# Patient Record
Sex: Male | Born: 1980 | Race: Black or African American | Hispanic: No | Marital: Single | Smoking: Never smoker
Health system: Southern US, Community
[De-identification: ages and names within clinical notes are randomized; demographics above are authoritative.]

## PROBLEM LIST (undated history)

## (undated) DIAGNOSIS — W57XXXA Bitten or stung by nonvenomous insect and other nonvenomous arthropods, initial encounter: Secondary | ICD-10-CM

## (undated) HISTORY — PX: NECK SURGERY: SHX720

---

## 2015-12-22 ENCOUNTER — Ambulatory Visit
Admission: EM | Admit: 2015-12-22 | Discharge: 2015-12-22 | Disposition: A | Discharge status: Home / Self Care | Payer: BLUE CROSS/BLUE SHIELD | Attending: Family Medicine | Admitting: Family Medicine

## 2015-12-22 ENCOUNTER — Ambulatory Visit (INDEPENDENT_AMBULATORY_CARE_PROVIDER_SITE_OTHER): Payer: BLUE CROSS/BLUE SHIELD

## 2015-12-22 ENCOUNTER — Encounter: Payer: Self-pay | Admitting: Emergency Medicine

## 2015-12-22 DIAGNOSIS — R109 Unspecified abdominal pain: Secondary | ICD-10-CM

## 2015-12-22 HISTORY — DX: Bitten or stung by nonvenomous insect and other nonvenomous arthropods, initial encounter: W57.XXXA

## 2015-12-22 LAB — URINALYSIS COMPLETE WITH MICROSCOPIC (ARMC ONLY)
BILIRUBIN URINE: NEGATIVE
Bacteria, UA: NONE SEEN
GLUCOSE, UA: NEGATIVE mg/dL
Ketones, ur: NEGATIVE mg/dL
LEUKOCYTES UA: NEGATIVE
NITRITE: NEGATIVE
Protein, ur: NEGATIVE mg/dL
pH: 5.5 (ref 5.0–8.0)

## 2015-12-22 NOTE — ED Notes (Addendum)
Abdominal pain left upper quadrant that radiates to back. Abdomen is swollen. Seen at [facility] was told it was a pulled muscle. No xrays performed

## 2015-12-22 NOTE — ED Provider Notes (Signed)
Patient presents today with symptoms of left flank pain extending to the left labdomen. Patient states that he's had the symptoms for several days. He states that he was [place] He was told that his symptoms were likely related to a muscle injury. Patient denies any heavy lifting or injury when the symptoms initially presented. He denies any fever, urinary symptoms, diarrhea, constipation, vomiting, penile discharge, dysuria. He denies any history of kidney stones. He denies any recent history of sore throat suggestive of infectious mono. He denies having a rash in the area of pain.  ROS: Negative except mentioned above.  Vitals as per Epic.  GENERAL: NAD HEENT: no pharyngeal erythema, no exudate, no cervical LAD RESP: CTA B CARD: RRR ABD: +BS, mild left flank tenderness extending to left abdomen, no rebound or guarding, no mass appreciated, no inquinal tenderness, no hernia appreciated  NEURO: CN II-XII grossly intact   A/P: Left-sided flank pain, Left abdominal pain-urine analysis and x-ray results were discussed with the patient. Would recommend CT scan to further evaluate patient's symptoms. Patient is in no acute distress at this time. I have asked that if he does have worsening symptoms that he goes to the ER. He should follow up with primary care physician in the near future to further workup his current symptoms with a CT scan.    Jolene ProvostKirtida Maili Shutters, MD 12/22/15 [phone]

## 2016-05-21 IMAGING — CR DG ABDOMEN 1V
2 series · 2 of 2 positions shown · non-contrast
Comparison: None.

CLINICAL DATA: Initial encounter for 2 week history of left lower
flank pain.

EXAM:
ABDOMEN - 1 VIEW

[abdomen kub (1 of 2)]
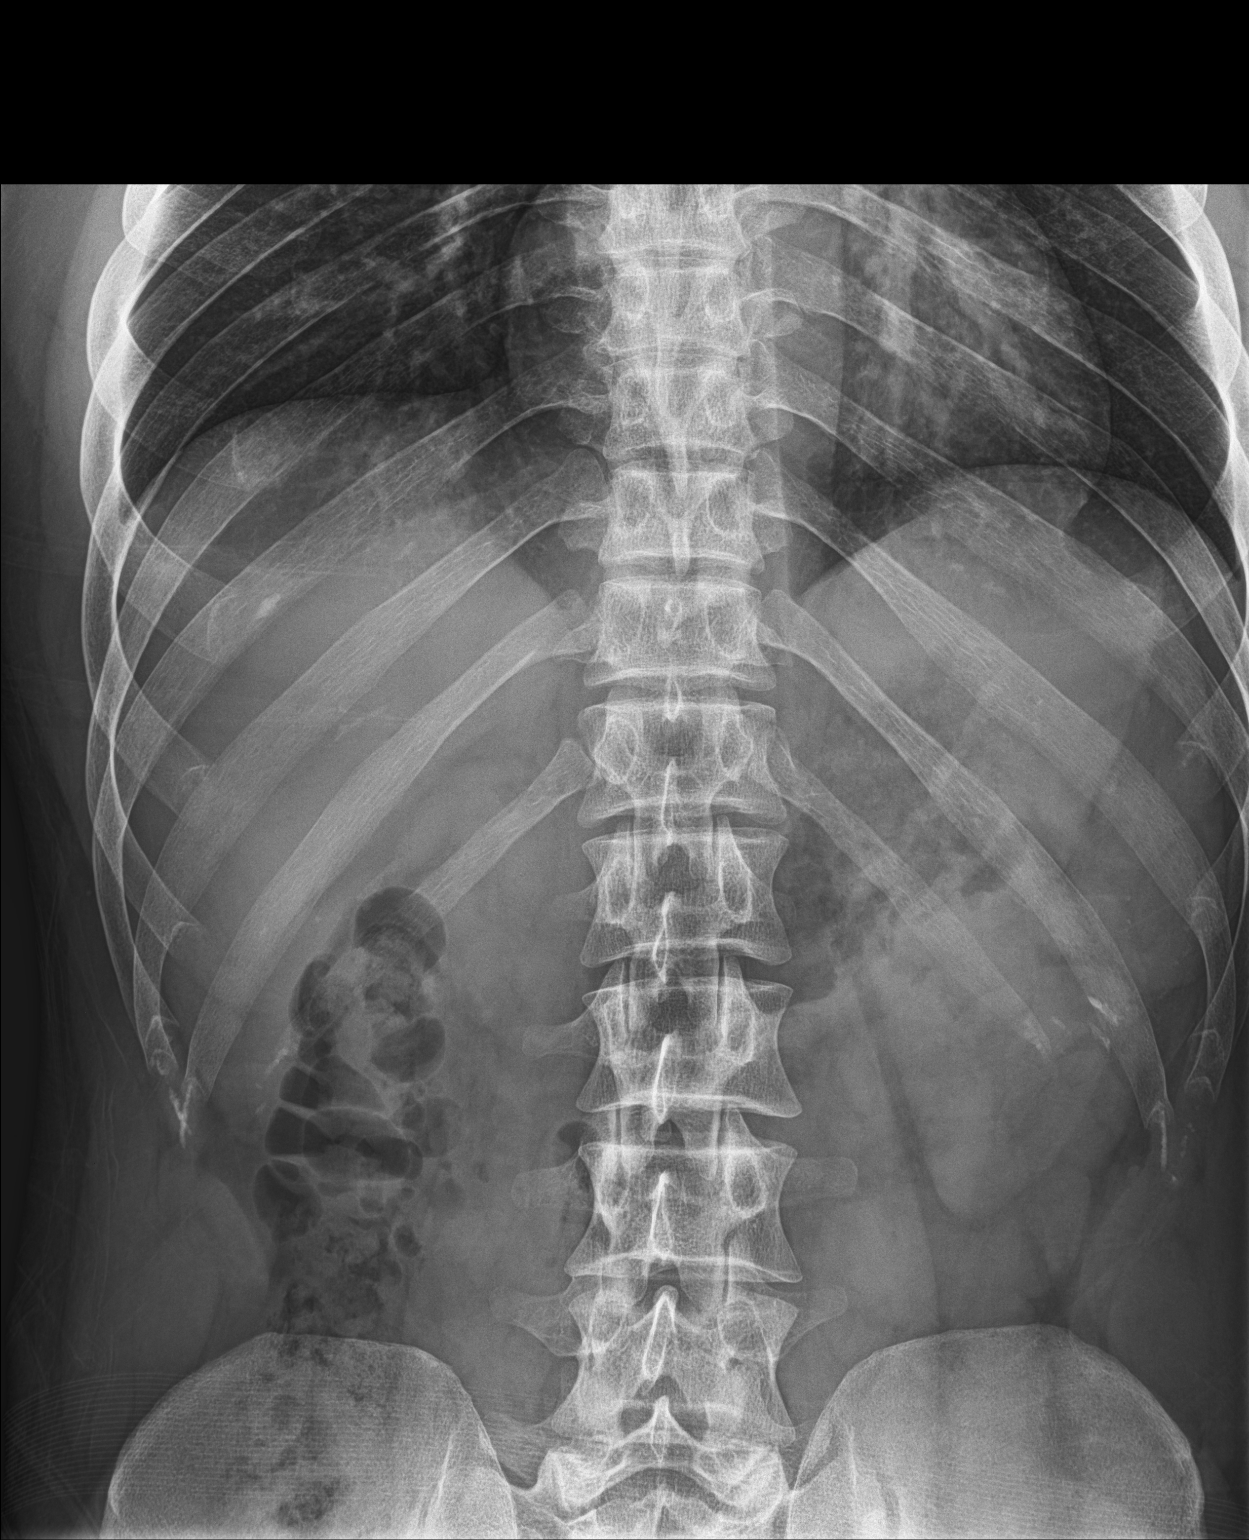

[abdomen kub (2 of 2)]
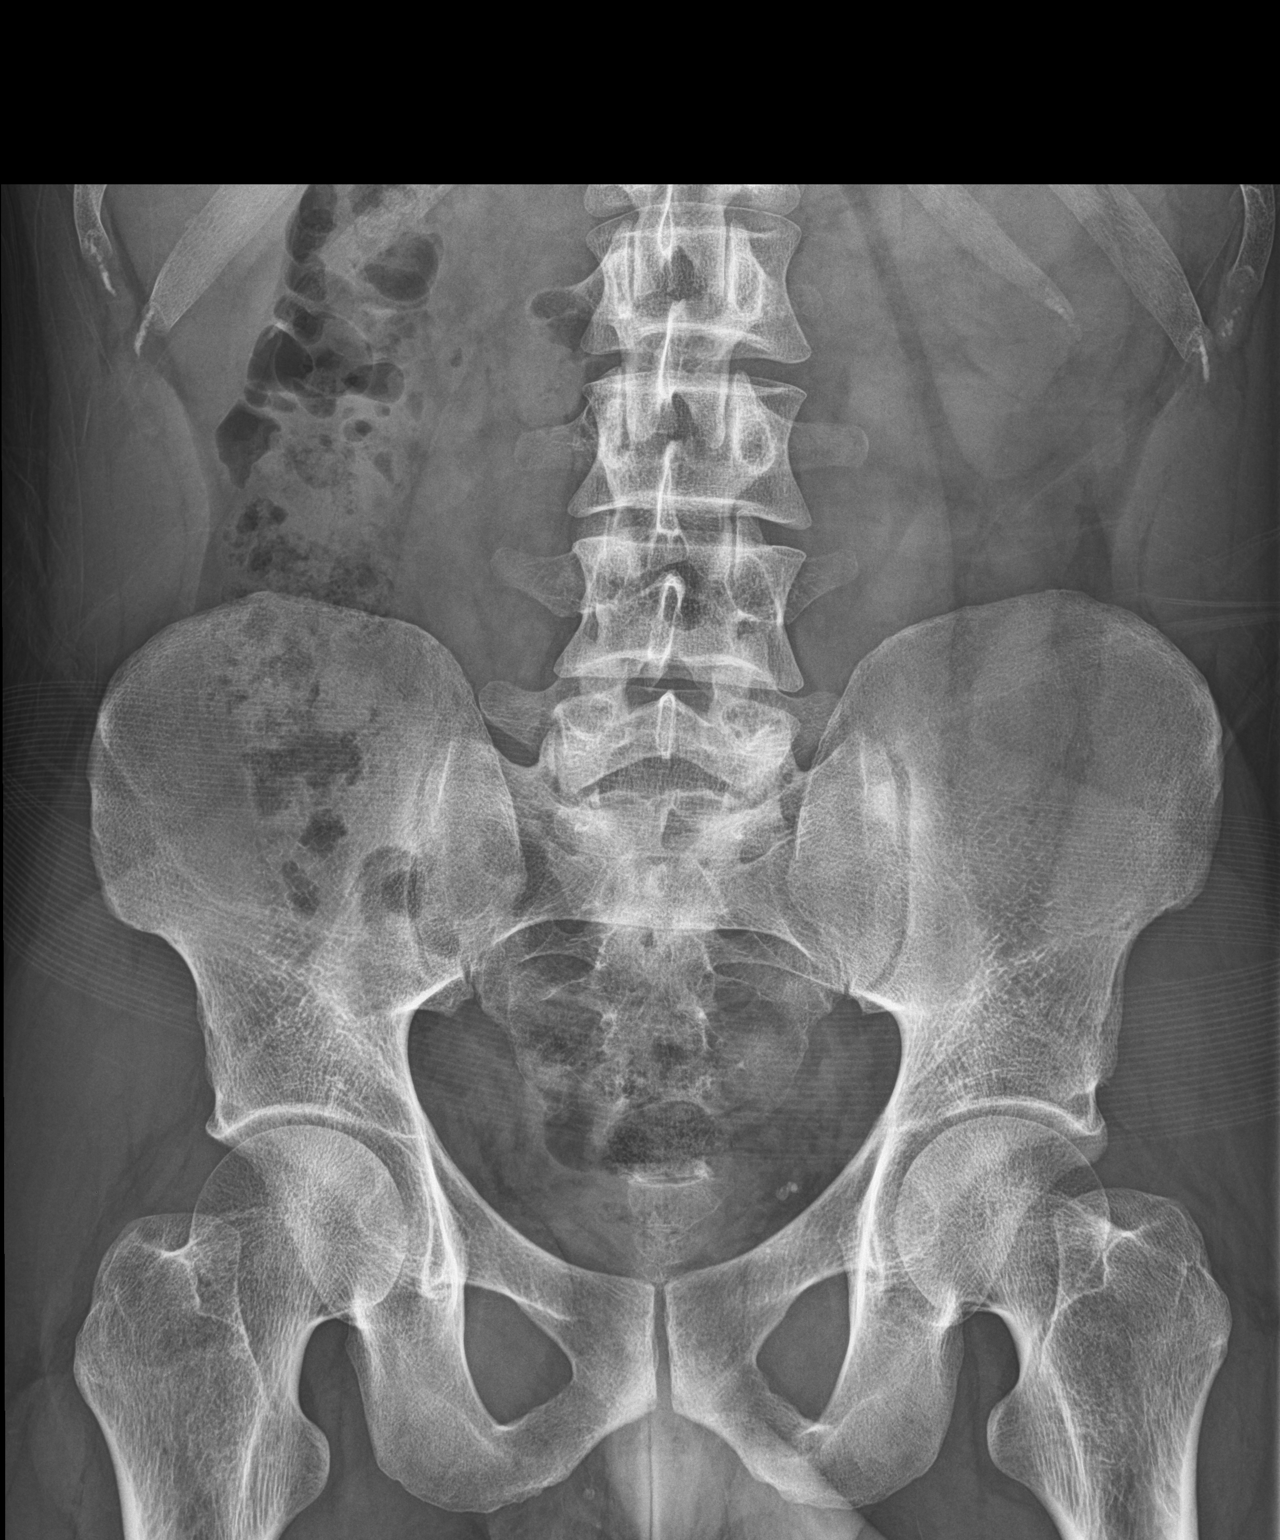

[2 of 2 positions shown; findings below may reference images not displayed]

FINDINGS: Supine abdomen shows no gaseous bowel dilatation to suggest
obstruction. No unexpected abdominal pelvic calcification.
Phleboliths overlie the lower left anatomic pelvis. Visualized bony
anatomy is normal.
IMPRESSION: No findings to explain the patient's history of pain.
